# Patient Record
Sex: Male | Born: 1995 | Race: Black or African American | Hispanic: No | Marital: Single | Smoking: Never smoker
Health system: Southern US, Community
[De-identification: ages and names within clinical notes are randomized; demographics above are authoritative.]

## PROBLEM LIST (undated history)

## (undated) DIAGNOSIS — S060XAA Concussion with loss of consciousness status unknown, initial encounter: Secondary | ICD-10-CM

## (undated) DIAGNOSIS — F0781 Postconcussional syndrome: Secondary | ICD-10-CM

## (undated) DIAGNOSIS — S060X9A Concussion with loss of consciousness of unspecified duration, initial encounter: Secondary | ICD-10-CM

## (undated) HISTORY — DX: Postconcussional syndrome: F07.81

## (undated) HISTORY — PX: SHOULDER SURGERY: SHX246

---

## 2017-09-06 ENCOUNTER — Other Ambulatory Visit: Payer: Self-pay

## 2017-09-06 ENCOUNTER — Encounter (HOSPITAL_COMMUNITY): Payer: Self-pay

## 2017-09-06 ENCOUNTER — Observation Stay (HOSPITAL_COMMUNITY)
Admission: EM | Admit: 2017-09-06 | Discharge: 2017-09-07 | Disposition: A | Discharge status: Home / Self Care | Payer: BLUE CROSS/BLUE SHIELD | Attending: Family Medicine | Admitting: Family Medicine

## 2017-09-06 DIAGNOSIS — E876 Hypokalemia: Secondary | ICD-10-CM | POA: Insufficient documentation

## 2017-09-06 DIAGNOSIS — N179 Acute kidney failure, unspecified: Secondary | ICD-10-CM | POA: Diagnosis not present

## 2017-09-06 DIAGNOSIS — E860 Dehydration: Secondary | ICD-10-CM | POA: Diagnosis present

## 2017-09-06 DIAGNOSIS — M6282 Rhabdomyolysis: Secondary | ICD-10-CM | POA: Diagnosis not present

## 2017-09-06 DIAGNOSIS — R748 Abnormal levels of other serum enzymes: Secondary | ICD-10-CM

## 2017-09-06 DIAGNOSIS — M62838 Other muscle spasm: Secondary | ICD-10-CM

## 2017-09-06 LAB — URINALYSIS, ROUTINE W REFLEX MICROSCOPIC
BILIRUBIN URINE: NEGATIVE
GLUCOSE, UA: NEGATIVE mg/dL
HGB URINE DIPSTICK: NEGATIVE
Ketones, ur: NEGATIVE mg/dL
Leukocytes, UA: NEGATIVE
Nitrite: NEGATIVE
Protein, ur: NEGATIVE mg/dL
SPECIFIC GRAVITY, URINE: 1.021 (ref 1.005–1.030)
pH: 5 (ref 5.0–8.0)

## 2017-09-06 LAB — BASIC METABOLIC PANEL
Anion gap: 11 (ref 5–15)
BUN: 19 mg/dL (ref 6–20)
CO2: 21 mmol/L — AB (ref 22–32)
CREATININE: 1.54 mg/dL — AB (ref 0.61–1.24)
Calcium: 10.1 mg/dL (ref 8.9–10.3)
Chloride: 107 mmol/L (ref 98–111)
GFR calc non Af Amer: >60 mL/min (ref >60)
Glucose, Bld: 96 mg/dL (ref 70–99)
Potassium: 4.4 mmol/L (ref 3.5–5.1)
SODIUM: 139 mmol/L (ref 135–145)

## 2017-09-06 LAB — CBC WITH DIFFERENTIAL/PLATELET
Abs Immature Granulocytes: 0 10*3/uL (ref 0.0–0.1)
Basophils Absolute: 0 10*3/uL (ref 0.0–0.1)
Basophils Relative: 0 %
Eosinophils Absolute: 0.2 10*3/uL (ref 0.0–0.7)
Eosinophils Relative: 2 %
HCT: 48.9 % (ref 39.0–52.0)
HEMOGLOBIN: 15.9 g/dL (ref 13.0–17.0)
IMMATURE GRANULOCYTES: 0 %
LYMPHS ABS: 1.3 10*3/uL (ref 0.7–4.0)
Lymphocytes Relative: 12 %
MCH: 30.6 pg (ref 26.0–34.0)
MCHC: 32.5 g/dL (ref 30.0–36.0)
MCV: 94 fL (ref 78.0–100.0)
MONOS PCT: 5 %
Monocytes Absolute: 0.6 10*3/uL (ref 0.1–1.0)
NEUTROS ABS: 8.3 10*3/uL — AB (ref 1.7–7.7)
Neutrophils Relative %: 81 %
Platelets: 178 10*3/uL (ref 150–400)
RBC: 5.2 MIL/uL (ref 4.22–5.81)
RDW: 13.2 % (ref 11.5–15.5)
WBC: 10.3 10*3/uL (ref 4.0–10.5)

## 2017-09-06 LAB — CK: CK TOTAL: 1824 U/L — AB (ref 49–397)

## 2017-09-06 MED ORDER — SODIUM CHLORIDE 0.9 % IV BOLUS
1000.0000 mL | Freq: Once | INTRAVENOUS | Status: AC
  Administered 2017-09-07: 1000 mL via INTRAVENOUS

## 2017-09-06 MED ORDER — SODIUM CHLORIDE 0.9 % IV BOLUS
1000.0000 mL | Freq: Once | INTRAVENOUS | Status: AC
  Administered 2017-09-06: 1000 mL via INTRAVENOUS

## 2017-09-06 NOTE — ED Provider Notes (Signed)
[facility] EM[facility]GENCY [facility] Provider Note   CSN: [id number] Arrival date & time: 09/06/17  1903     History   Chief Complaint Chief Complaint  Patient presents with  . Spasms    HPI Sean Love is a 22 y.o. male with no significant past medical history who presents today for evaluation of muscle cramps.  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  He reports that yesterday he started having cramps and pain in his arms and legs, and does not feel like he adequately rehydrated after practice yesterday.  Initially he started having pain in his quads bilaterally today during practice which slowly went away.  He reports that towards the end of practice coach had him doing "up downs" and his bilateral arms and legs became very sore and locked up.  He is unable to remember the last time he urinated.  He reports that his pain is worse in his legs specifically in his quads, however is in his arms also.  He denies any recent illness or sickness, no nausea or vomiting.  He denies any abdominal pain, chest pain, or shortness of breath.  HPI  History reviewed. No pertinent past medical history.  There are no active problems to display for this patient.   History reviewed. No pertinent surgical history.      Home Medications    Prior to Admission medications   Not on File    Family History History reviewed. No pertinent family history.  Social History Social History   Tobacco Use  . Smoking status: Unknown If Ever Smoked  Substance Use Topics  . Alcohol use: Not on file  . Drug use: Not on file     Allergies   Patient has no known allergies.   Review of Systems Review of Systems  Constitutional: Negative for chills and fever.  HENT: Negative for congestion.   Eyes: Negative for visual disturbance.  Respiratory: Negative for chest tightness and shortness of breath.   Gastrointestinal: Negative for abdominal pain, diarrhea, nausea and vomiting.    Genitourinary: Positive for decreased urine volume. Negative for difficulty urinating and dysuria.  Musculoskeletal: Positive for myalgias. Negative for arthralgias, back pain, neck pain and neck stiffness.  Skin: Negative for color change, pallor and wound.  Neurological: Negative for tremors, weakness, light-headedness, numbness and headaches.  All other systems reviewed and are negative.    Physical Exam Updated Vital Signs BP 121/84 (BP Location: Right Arm)   Pulse 76   Temp 99.4 F (37.4 C) (Oral)   Resp (!) 22   Ht 6\' 2"  (1.88 m)   Wt (!) 138.3 kg (305 lb)   SpO2 99%   BMI 39.16 kg/m   Physical Exam  Constitutional: He is oriented to person, place, and time. He appears well-developed and well-nourished. No distress.  HENT:  Head: Normocephalic and atraumatic.  Mouth/Throat: Oropharynx is clear and moist.  Eyes: Conjunctivae are normal.  Neck: Neck supple.  Cardiovascular: Normal rate, regular rhythm, normal heart sounds and intact distal pulses.  No murmur heard. Pulmonary/Chest: Effort normal and breath sounds normal. No respiratory distress.  Abdominal: Soft. There is no tenderness.  Musculoskeletal: He exhibits no edema.  Diffuse tenderness to palpation over bilateral arms and legs, worse in anterior upper legs.  No deformities, crepitus, ecchymosis or edema palpated.  Neurological: He is alert and oriented to person, place, and time.  Skin: Skin is warm and dry. He is not diaphoretic.  Psychiatric: He has a normal mood  and affect.  Nursing note and vitals reviewed.    ED Treatments / Results  Labs (all labs ordered are listed, but only abnormal results are displayed) Labs Reviewed  CBC WITH DIFFERENTIAL/PLATELET - Abnormal; Notable for the following components:      Result Value   Neutro Abs 8.3 (*)    All other components within normal limits  BASIC METABOLIC PANEL - Abnormal; Notable for the following components:   CO2 21 (*)    Creatinine, Ser 1.54  (*)    All other components within normal limits  CK - Abnormal; Notable for the following components:   Total CK 1,824 (*)    All other components within normal limits  URINALYSIS, ROUTINE W REFLEX MICROSCOPIC    EKG None  Radiology No results found.  Procedures Procedures (including critical care time)  Medications Ordered in ED Medications  sodium chloride 0.9 % bolus 1,000 mL (1,000 mLs Intravenous New Bag/Given 09/06/17 2145)  sodium chloride 0.9 % bolus 1,000 mL (has no administration in time range)  sodium chloride 0.9 % bolus 1,000 mL (0 mLs Intravenous Stopped 09/06/17 2146)     Initial Impression / Assessment and Plan / ED Course  I have reviewed the triage vital signs and the nursing notes.  Pertinent labs & imaging results that were available during my care of the patient were reviewed by me and considered in my medical decision making (see chart for details).  Clinical Course as of Sep 07 2234  Sat Sep 06, 2017  2121 Results discussed with patient, he was informed of elevated CK and creatinine.  Team's athletic trainer who is in the room states that patient already has a follow-up appointment with the team physician on Monday or Tuesday for repeat evaluation and lab draws.  Plan to give 2 additional liters IV allow patient to orally rehydrate, then we will recheck creatinine and CK for improvement.   [EH]    Clinical Course User Index [EH] Cristina Gong, PA-C    Patient presents today for evaluation of muscle spasms.  He was given 500 mL's of fluid by EMS and 100 MCG of fentanyl prior to arrival.  He has been exercising significantly, reports that he has not been taking adequate fluids, and is unsure of the last time he urinated.  Labs were obtained, creatinine is elevated at 1.54, no history to compare to.  CK is elevated at 1, 824.  Patient was given 3 L of IV fluids while in the department, along with encouraged to have p.o. Intake.  Urine was normal without  evidence of infection.  Differential did show elevated neutrophils, however suspect that that is secondary to stress and pain from muscle cramps and spasms.  Plan to redraw CK, creatinine at around 1130 to evaluate.  If they are decreasing will discharge patient home most likely.  At shift change care was transferred to [facility] who will follow pending studies, re-evaulate and determine disposition.     Final Clinical Impressions(s) / ED Diagnoses   Final diagnoses:  Dehydration  Elevated CK  Muscle spasm    ED Discharge Orders    None       Norman Clay 09/06/17 2236    Arby Barrette, MD 09/22/17 1535

## 2017-09-06 NOTE — ED Triage Notes (Signed)
Pt at [employer] football camp. Reports cramps in arms and legs since yesterday. Worse today. Received NS and fentanyl en route.

## 2017-09-07 ENCOUNTER — Other Ambulatory Visit: Payer: Self-pay

## 2017-09-07 DIAGNOSIS — M6282 Rhabdomyolysis: Secondary | ICD-10-CM | POA: Diagnosis not present

## 2017-09-07 DIAGNOSIS — E860 Dehydration: Secondary | ICD-10-CM

## 2017-09-07 LAB — BASIC METABOLIC PANEL
Anion gap: 7 (ref 5–15)
Anion gap: 8 (ref 5–15)
BUN: 10 mg/dL (ref 6–20)
BUN: 11 mg/dL (ref 6–20)
CALCIUM: 8.7 mg/dL — AB (ref 8.9–10.3)
CALCIUM: 8.7 mg/dL — AB (ref 8.9–10.3)
CO2: 24 mmol/L (ref 22–32)
CO2: 22 mmol/L (ref 22–32)
CREATININE: 1.09 mg/dL (ref 0.61–1.24)
Chloride: 110 mmol/L (ref 98–111)
Chloride: 111 mmol/L (ref 98–111)
Creatinine, Ser: 1.04 mg/dL (ref 0.61–1.24)
GFR calc Af Amer: >60 mL/min (ref >60)
GFR calc non Af Amer: >60 mL/min (ref >60)
GLUCOSE: 86 mg/dL (ref 70–99)
Glucose, Bld: 91 mg/dL (ref 70–99)
Potassium: 3.3 mmol/L — ABNORMAL LOW (ref 3.5–5.1)
Potassium: 4.2 mmol/L (ref 3.5–5.1)
SODIUM: 141 mmol/L (ref 135–145)
Sodium: 141 mmol/L (ref 135–145)

## 2017-09-07 LAB — CK
CK TOTAL: 2557 U/L — AB (ref 49–397)
CK TOTAL: 2285 U/L — AB (ref 49–397)
Total CK: 2667 U/L — ABNORMAL HIGH (ref 49–397)

## 2017-09-07 LAB — HIV ANTIBODY (ROUTINE TESTING W REFLEX): HIV Screen 4th Generation wRfx: NONREACTIVE

## 2017-09-07 LAB — CREATININE, SERUM
CREATININE: 1.19 mg/dL (ref 0.61–1.24)
GFR calc non Af Amer: >60 mL/min (ref >60)

## 2017-09-07 MED ORDER — POTASSIUM CHLORIDE CRYS ER 20 MEQ PO TBCR
40.0000 meq | EXTENDED_RELEASE_TABLET | Freq: Once | ORAL | Status: AC
  Administered 2017-09-07: 40 meq via ORAL
  Filled 2017-09-07: qty 2

## 2017-09-07 MED ORDER — SODIUM CHLORIDE 0.9 % IV SOLN
INTRAVENOUS | Status: DC
  Administered 2017-09-07 (×2): via INTRAVENOUS

## 2017-09-07 NOTE — Discharge Summary (Signed)
[facility]  Patient name: Sean Lodema HongSimpson Medical record number: [id number] Date of birth: [birth year] Age: 22 y.o. Gender: male Date of Admission: 09/06/2017  Date of Discharge: 09/07/2017 Admitting Physician: Carney LivingMarshall L Chambliss, MD  Primary Care Provider: Patient, No Pcp Per Consultations: none  Indication for Hospitalization: rhabdomyolysis, dehydration   Discharge Diagnoses/Problem List:  Rhabdomyolysis/dehydration -continuous IV fluids -monitor kidney function and CK levels  Hypokalemia -mild, resolved -potassium PO repletion   Disposition: discharge to home  Discharge Condition: stable, improved  Discharge Exam:  General: well-appearing Heart: RRR. No gallop, rubs, murmmers auscultated  Lungs: CTAB Extremities: no swelling or redness. Painless ROM. Mild tenderness to palpation on calves bilaterally, right worse than left.  Brief Hospital Course:  Presented 09/06/2017 for arm and leg muscle cramps that began after football practice without improvement with ice bath. Received fluid bolus in ED and maintenance fluids. CK levels trended Creatinine and electrolyte levels trended Patient vitals remained stable throughout visit. Slight decrease in potassium to 3.3 repleated with 40mEq PO KCl. Good response Patient had good oral intake and CK levels did not increase significantly. Symptoms improved/resolved. Fluids discontinued. Patient discharged home.  Issues for Follow Up:  1. Follow up with PCP to repeat creatine, electrolytes and CK levels 2. Return to activity- gradual increase level of involvement 1. Encouraging hydration 2. Frequent periods of rest  Significant Procedures: none  Significant Labs and Imaging:  Recent Labs  Lab 09/06/17 2010  WBC 10.3  HGB 15.9  HCT 48.9  PLT 178   Recent Labs  Lab 09/06/17 2010 09/07/17 0014 09/07/17 0354  NA 139  --  141  K 4.4  --  3.3*  CL 107  --  110  CO2 21*  --   24  GLUCOSE 96  --  86  BUN 19  --  10  CREATININE 1.54* 1.19 1.04  CALCIUM 10.1  --  8.7*   CK 8/3: 1,824. CK 8/4: 2,285, 2,557, 2,667 chronologically  Creatinine at discharge was 1.09   Results/Tests Pending at Time of Discharge: none  Discharge Medications:  Allergies as of 09/07/2017   No Known Allergies     Medication List    You have not been prescribed any medications.     Discharge Instructions: Please refer to Patient Instructions section of EMR for full details.  Patient was counseled important signs and symptoms that should prompt return to medical care, changes in medications, dietary instructions, activity restrictions, and follow up appointments.   Follow-Up Appointments: Follow-up Information    Schedule an appointment as soon as possible for a visit  with Your team physician.        Go to  [facility].   Specialty:  Emergency Medicine Why:  As needed, If symptoms worsen Contact information: [address] [id number] mc [address] [id number] [phone]          Leeroy Bocknderson, Aleaha Fickling L, DO 09/07/2017, 7:42 AM PGY-1, [facility]

## 2017-09-07 NOTE — ED Notes (Signed)
Pt's mom at bedside.

## 2017-09-07 NOTE — Progress Notes (Signed)
FPTS Interim Progress Note  S: No acute events since admission overnight. This morning, patient is resting and appeared comfortable. He denies any cramping. Endorses residual mild soreness in legs bilaterally and resolved in arms. He slept well and has been tolerating food and fluids orally well. He and his mother have no other concerns at this time.  O: BP 134/78 (BP Location: Right Arm)   Pulse (!) 56   Temp 98.2 F (36.8 C) (Oral)   Resp (!) 22   Ht 6\' 3"  (1.905 m)   Wt (!) 309 lb 1.4 oz (140.2 kg)   SpO2 99%   BMI 38.63 kg/m   CK showed mild increase this morning at 2,557 (from 2,285).  Creatinine improved to 1.04,  Potassium decreased to 3.3  General: NAD, comfortable Heart: RRR, no murmers Lungs: CTAB Extremities: no tenderness to palpation on upper extremities or thighs. Mild tenderness to palpation on calves left worse than right. No swelling or redness.  A/P: Rhabdomyolysis Admitted 8/3 for muscle cramping after football practice. Repeat CK levels show continued increasing plasma levels this morning.  -repeat CK and BMP in afternoon -continue on fluids 25600mL/hr -encourage oral intake of food and fluids  Mild hypokalemia Labs today showed 3.3 -replete with oral KCl 40mEq -follow with afternoon BMP   Leeroy BockAnderson, Anthonie Lotito L, DO 09/07/2017, 9:08 AM PGY-1, [facility] pager [phone]

## 2017-09-07 NOTE — H&P (Signed)
Family Medicine Teaching [facility] Admission History and Physical Service Pager: [phone]  Patient name: Sean Love Medical record number: [id number] Date of birth: 1995-09-01 Age: 22 y.o. Gender: male  Primary Care Provider: Patient, No Pcp Per Consultants: none Code Status: full  Chief Complaint: muscle cramps  Assessment and Plan: Sean Ellena is a 23 y.o. male presenting with muscle cramps and AKI due to rhabdomyolysis.  He has no significant PMH.  Rhabdomyolysis: Due to intense physical activity at football practice, exacerbated by dehydration.  CK found to be 1,824>2,285 on repeat measurement, and creatinine elevated to 1.54 on admission.  After fluid administration, creatinine improved to 1.19.  Vitals wnl, EKG with normal sinus rhythm. Electrolytes and CBC wnl.  Patient's muscle cramping improved after rest and hydration with 3 L NS in the ED.  No signs of compartment syndrome or cardiac involvement. - admit to observation, attending Dr. Deirdre Priest - NS infusion @ 200 ml/hr - regular diet - am BMP - vitals per unit routine  AKI, resolved: Likely combination of injury from myoglobulin and dehydration.  Improved with fluids. - am BMP - fluid administration  FEN/GI: NS infusion  Prophylaxis: early ambulation  Disposition: home  History of Present Illness:  Sean Mullett is a 22 y.o. male presenting with muscle cramps, found to have rhabdomyolysis.  He began having cramps during football practice on Friday, August 2.  He had some muscle cramping after weight lifting on Friday and doesn't think he hydrated very well during practice.  On Saturday, he lifted weights again during practice and noticed continued muscle cramping that became worse when he began doing up downs.  He continued with practice, hoping that the muscle cramps would go away, but the cramps got so bad that he could not flex or extend his arms and legs easily.  He stopped practice at that time, drink  some Pedialyte, and got in an ice bath.  When his symptoms did not resolve with the ice bath, his coach called EMS.  Patient reports that he began feeling improvement in his muscle cramping once he started getting IV fluids.  He says that he drank "a few sips of alcohol" 2 nights ago, but did not have more than one shot.  He denies chest pain.  He does endorse having dark urine over the past day.  After receiving fluids in the ED, patient's cramping resolved, and he is not currently in any discomfort.  Review Of Systems: Per HPI with the following additions:   Review of Systems  Constitutional: Negative for chills and fever.  Respiratory: Negative for shortness of breath.   Cardiovascular: Negative for chest pain, palpitations and leg swelling.  Gastrointestinal: Negative for constipation, diarrhea, nausea and vomiting.  Genitourinary: Negative for dysuria.  Musculoskeletal: Positive for myalgias.  Neurological: Negative for dizziness and headaches.    Patient Active Problem List   Diagnosis Date Noted  . Rhabdomyolysis 09/07/2017  . Dehydration     Past Medical History: History reviewed. No pertinent past medical history.  Past Surgical History: History reviewed. No pertinent surgical history.  Social History: Social History   Tobacco Use  . Smoking status: Unknown If Ever Smoked  Substance Use Topics  . Alcohol use: Not on file  . Drug use: Not on file   Additional social history: denies drug use  Please also refer to relevant sections of EMR.  Family History: History reviewed. No pertinent family history. Sickle cell trait on mother's side.  He does not have the trait.  Allergies and Medications: No Known Allergies No current facility-administered medications on file prior to encounter.    No current outpatient medications on file prior to encounter.    Objective: BP (!) 144/86   Pulse (!) 56   Temp 98.2 F (36.8 C) (Oral)   Resp 16   Ht 6\' 2"  (1.88 m)   Wt  (!) 305 lb (138.3 kg)   SpO2 98%   BMI 39.16 kg/m  Physical Exam  Constitutional: He is oriented to person, place, and time. He appears well-developed and well-nourished. No distress.  HENT:  Head: Normocephalic and atraumatic.  Eyes: Conjunctivae and EOM are normal. Right eye exhibits no discharge. Left eye exhibits no discharge. No scleral icterus.  Neck: Normal range of motion.  Cardiovascular: Normal rate, regular rhythm, normal heart sounds and intact distal pulses.  Pulmonary/Chest: Effort normal and breath sounds normal. No respiratory distress.  Abdominal: Soft. Bowel sounds are normal. He exhibits no distension. There is no tenderness. There is no guarding.  Musculoskeletal: Normal range of motion. He exhibits no edema, tenderness or deformity.  Neurological: He is alert and oriented to person, place, and time.  Skin: Skin is warm and dry. Capillary refill takes less than 2 seconds. No rash noted.  Psychiatric: He has a normal mood and affect. His behavior is normal.    Labs and Imaging: CBC BMET  Recent Labs  Lab 09/06/17 2010  WBC 10.3  HGB 15.9  HCT 48.9  PLT 178   Recent Labs  Lab 09/06/17 2010 09/07/17 0014  NA 139  --   K 4.4  --   CL 107  --   CO2 21*  --   BUN 19  --   CREATININE 1.54* 1.19  GLUCOSE 96  --   CALCIUM 10.1  --      CK 1,824, 2,285  Lennox SoldersWinfrey, Amanda C, MD 09/07/2017, 1:52 AM PGY-2, [facility] FPTS Intern pager: [phone], text pages welcome

## 2017-09-07 NOTE — ED Notes (Signed)
Pt amublatory to restroom, pt wanting to take shower at this time.

## 2017-09-07 NOTE — Discharge Instructions (Signed)
Today your initial blood work showed that you had elevated levels of CK which is a product of muscle breakdown in addition your creatinine was elevated.  Your CK was 1,824 on admission and 2,667 today..  Your creatinine was 1.54 on admission and 1 today.    Please do not return to sports until you are cleared by a physician.  It is very important that you are staying well-hydrated and replacing fluid losses from physical activity.   Rhabdomyolysis Rhabdomyolysis is a condition that happens when muscle cells break down and release substances into the blood that can damage the kidneys. This condition happens because of damage to the muscles that move bones (skeletal muscle). When the skeletal muscles are damaged, substances inside the muscle cells go into the blood. One of these substances is a protein called myoglobin. Large amounts of myoglobin can cause kidney damage or kidney failure. Other substances that are released by muscle cells may upset the balance of the minerals (electrolytes) in your blood. This imbalance causes your blood to have too much acid (acidosis). What are the causes? This condition is caused by muscle damage. Muscle damage often happens because of:  Using your muscles too much.  An injury that crushes or squeezes a muscle too tightly.  Using illegal drugs, mainly cocaine.  Alcohol abuse.  Other possible causes include:  Prescription medicines, such as those that: ? Lower cholesterol (statins). ? Treat ADHD (attention deficit hyperactivity disorder) or help with weight loss (amphetamines). ? Treat pain (opiates).  Infections.  Muscle diseases that are passed down from parent to child (inherited).  High fever.  Heatstroke.  Not having enough fluids in your body (dehydration).  Seizures.  Surgery.  What increases the risk? This condition is more likely to develop in people who:  Have a family history of muscle disease.  Take part in extreme sports,  such as running in marathons.  Have diabetes.  Are older.  Abuse drugs or alcohol.  What are the signs or symptoms? Symptoms of this condition vary. Some people have very few symptoms, and other people have many symptoms. The most common symptoms include:  Muscle pain and swelling.  Weak muscles.  Dark urine.  Feeling weak and tired.  Other symptoms include:  Nausea and vomiting.  Fever.  Pain in the abdomen.  Pain in the joints.  Symptoms of complications from this condition include:  Heart rhythm that is not normal (arrhythmia).  Seizures.  Not urinating enough because of kidney failure.  Very low blood pressure (shock). Signs of shock include dizziness, blurry vision, and clammy skin.  Bleeding that is hard to stop or control.  How is this diagnosed? This condition is diagnosed based on your medical history, your symptoms, and a physical exam. Tests may also be done, including:  Blood tests.  Urine tests to check for myoglobin.  You may also have other tests to check for causes of muscle damage and to check for complications. How is this treated? Treatment for this condition helps to:  Make sure you have enough fluids in your body.  Lower the acid levels in your blood to reverse acidosis.  Protect your kidneys.  Treatment may include:  Fluids and medicines given through an IV tube that is inserted into one of your veins.  Medicines to lower acidosis or to bring back the balance of the minerals in your body.  Hemodialysis. This treatment uses an artificial kidney machine to filter your blood while you recover. You may have  this if other treatments are not helping.  Follow these instructions at home:  Take over-the-counter and prescription medicines only as told by your health care provider.  Rest at home until your health care provider says that you can return to your normal activities.  Drink enough fluid to keep your urine clear or pale  yellow.  Do not do activities that take a lot of effort (are strenuous). Ask your health care provider what level of exercise is safe for you.  Do not abuse drugs or alcohol. If you are having problems with drug or alcohol use, ask your health care provider for help.  Keep all follow-up visits as told by your health care provider. This is important. Contact a health care provider if:  You start having symptoms of this condition after treatment. Get help right away if:  You have a seizure.  You bleed easily or cannot control bleeding.  You cannot urinate.  You have chest pain.  You have trouble breathing. This information is not intended to replace advice given to you by your health care provider. Make sure you discuss any questions you have with your health care provider. Document Released: 01/04/2004 Document Revised: 11/03/2015 Document Reviewed: 11/03/2015 Elsevier Interactive Patient Education  [employer]2018 Elsevier Inc.

## 2017-09-07 NOTE — ED Provider Notes (Signed)
Care assumed from [facility] Hammond, [place]PA-C.  Please see her full H&P.  In short,  Sean Love is a 22 y.o. male presents for muscle cramps worsening over the last several days.  He is currently attending [employer] football camp.  He reports pain and cramps in his arms, legs.  He states he did not feel like he adequately rehydrated after practice yesterday and has had some limited urination.  Physical Exam  BP (!) 142/78 (BP Location: Left Arm)   Pulse 66   Temp 98.2 F (36.8 C) (Oral)   Resp 16   Ht 6\' 2"  (1.88 m)   Wt (!) 138.3 kg (305 lb)   SpO2 100%   BMI 39.16 kg/m   Physical Exam  Constitutional: He appears well-developed and well-nourished. No distress.  HENT:  Head: Normocephalic.  Eyes: Conjunctivae are normal. No scleral icterus.  Neck: Normal range of motion.  Cardiovascular: Normal rate and intact distal pulses.  Pulmonary/Chest: Effort normal.  Musculoskeletal: Normal range of motion.  Neurological: He is alert.  Skin: Skin is warm and dry.  Nursing note and vitals reviewed.   ED Course/Procedures   Clinical Course as of Sep 07 199  Sat Sep 06, 2017  2121 Results discussed with patient, he was informed of elevated CK and creatinine.  Team's athletic trainer who is in the room states that patient already has a follow-up appointment with the team physician on Monday or Tuesday for repeat evaluation and lab draws.  Plan to give 2 additional liters IV allow patient to orally rehydrate, then we will recheck creatinine and CK for improvement.   [EH]  Sun Sep 07, 2017  0108 Patient reports significant decrease in his cramping.  He has had a total of 3.5 L of fluid.  Repeat labs are pending.   [HM]  0121 Increasing despite fluids.  Patient will need admission.  CK Total(!): 2,285 [HM]  0121 Serum creatinine has improved.  Creatinine: 1.19 [HM]  0159 Discussed with Dr. Frances FurbishWinfrey who will admit.     [HM]    Clinical Course User Index [EH] Cristina GongHammond, Elizabeth W, PA-C [HM]  Ritaj Dullea, Dahlia ClientHannah, PA-C    Procedures  MDM    Pt with elevated CK and Creatinine. Concern for Rhabdo.  Pt hydrated and has improved creatinine, but rising CK despite fluids.  No AMS.  Vials stable.  Pt will need admission.    Dehydration  Elevated CK  Muscle spasm  Non-traumatic rhabdomyolysis      Milta DeitersMuthersbaugh, Shiree Altemus, PA-C 09/07/17 0201    Linwood DibblesKnapp, Jon, MD 09/09/17 [phone]

## 2018-03-22 ENCOUNTER — Other Ambulatory Visit: Payer: Self-pay

## 2018-03-22 ENCOUNTER — Emergency Department (HOSPITAL_COMMUNITY): Payer: BLUE CROSS/BLUE SHIELD

## 2018-03-22 ENCOUNTER — Emergency Department (HOSPITAL_COMMUNITY)
Admission: EM | Admit: 2018-03-22 | Discharge: 2018-03-22 | Disposition: A | Discharge status: Home / Self Care | Payer: BLUE CROSS/BLUE SHIELD | Attending: Emergency Medicine | Admitting: Emergency Medicine

## 2018-03-22 ENCOUNTER — Encounter (HOSPITAL_COMMUNITY): Payer: Self-pay

## 2018-03-22 DIAGNOSIS — M542 Cervicalgia: Secondary | ICD-10-CM | POA: Diagnosis present

## 2018-03-22 DIAGNOSIS — Y92481 Parking lot as the place of occurrence of the external cause: Secondary | ICD-10-CM | POA: Diagnosis not present

## 2018-03-22 DIAGNOSIS — M25561 Pain in right knee: Secondary | ICD-10-CM | POA: Diagnosis not present

## 2018-03-22 DIAGNOSIS — Y939 Activity, unspecified: Secondary | ICD-10-CM | POA: Insufficient documentation

## 2018-03-22 DIAGNOSIS — Y998 Other external cause status: Secondary | ICD-10-CM | POA: Insufficient documentation

## 2018-03-22 DIAGNOSIS — R51 Headache: Secondary | ICD-10-CM | POA: Diagnosis not present

## 2018-03-22 HISTORY — DX: Concussion with loss of consciousness of unspecified duration, initial encounter: S06.0X9A

## 2018-03-22 HISTORY — DX: Concussion with loss of consciousness status unknown, initial encounter: S06.0XAA

## 2018-03-22 NOTE — ED Notes (Signed)
Patient verbalizes understanding of discharge instructions. Opportunity for questioning and answers were provided. Armband removed by staff, pt discharged from ED.  

## 2018-03-22 NOTE — Discharge Instructions (Addendum)
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
## 2018-04-16 ENCOUNTER — Ambulatory Visit (INDEPENDENT_AMBULATORY_CARE_PROVIDER_SITE_OTHER): Payer: BLUE CROSS/BLUE SHIELD | Admitting: Neurology

## 2018-04-16 ENCOUNTER — Encounter: Payer: Self-pay | Admitting: Neurology

## 2018-04-16 ENCOUNTER — Other Ambulatory Visit: Payer: Self-pay

## 2018-04-16 DIAGNOSIS — F0781 Postconcussional syndrome: Secondary | ICD-10-CM

## 2018-04-16 HISTORY — DX: Postconcussional syndrome: F07.81

## 2018-04-16 MED ORDER — NORTRIPTYLINE HCL 10 MG PO CAPS: ORAL_CAPSULE | ORAL | 3 refills | Status: DC
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
## 2018-05-18 ENCOUNTER — Other Ambulatory Visit: Payer: Self-pay | Admitting: Neurology

## 2018-08-11 ENCOUNTER — Emergency Department (HOSPITAL_COMMUNITY)
Admission: EM | Admit: 2018-08-11 | Discharge: 2018-08-11 | Disposition: A | Discharge status: Home / Self Care | Payer: 59 | Attending: Pediatric Emergency Medicine | Admitting: Pediatric Emergency Medicine

## 2018-08-11 ENCOUNTER — Emergency Department (HOSPITAL_COMMUNITY): Payer: 59

## 2018-08-11 ENCOUNTER — Encounter (HOSPITAL_COMMUNITY): Payer: Self-pay | Admitting: Emergency Medicine

## 2018-08-11 DIAGNOSIS — F419 Anxiety disorder, unspecified: Secondary | ICD-10-CM | POA: Diagnosis not present

## 2018-08-11 DIAGNOSIS — R51 Headache: Secondary | ICD-10-CM | POA: Insufficient documentation

## 2018-08-11 DIAGNOSIS — F1290 Cannabis use, unspecified, uncomplicated: Secondary | ICD-10-CM | POA: Diagnosis not present

## 2018-08-11 DIAGNOSIS — R079 Chest pain, unspecified: Secondary | ICD-10-CM

## 2018-08-11 DIAGNOSIS — R519 Headache, unspecified: Secondary | ICD-10-CM

## 2018-08-11 DIAGNOSIS — R0789 Other chest pain: Secondary | ICD-10-CM | POA: Diagnosis present

## 2018-08-11 LAB — CBC
HCT: 45.6 % (ref 39.0–52.0)
Hemoglobin: 14.9 g/dL (ref 13.0–17.0)
MCH: 30.4 pg (ref 26.0–34.0)
MCHC: 32.7 g/dL (ref 30.0–36.0)
MCV: 93.1 fL (ref 80.0–100.0)
Platelets: 167 K/uL (ref 150–400)
RBC: 4.9 MIL/uL (ref 4.22–5.81)
RDW: 12.9 % (ref 11.5–15.5)
WBC: 4.6 K/uL (ref 4.0–10.5)
nRBC: 0 % (ref 0.0–0.2)

## 2018-08-11 LAB — BASIC METABOLIC PANEL WITH GFR
Anion gap: 7 (ref 5–15)
BUN: 5 mg/dL — ABNORMAL LOW (ref 6–20)
CO2: 25 mmol/L (ref 22–32)
Calcium: 9.4 mg/dL (ref 8.9–10.3)
Chloride: 109 mmol/L (ref 98–111)
Creatinine, Ser: 1.13 mg/dL (ref 0.61–1.24)
GFR calc Af Amer: >60 mL/min (ref >60)
GFR calc non Af Amer: >60 mL/min (ref >60)
Glucose, Bld: 94 mg/dL (ref 70–99)
Potassium: 4.3 mmol/L (ref 3.5–5.1)
Sodium: 141 mmol/L (ref 135–145)

## 2018-08-11 LAB — TROPONIN I (HIGH SENSITIVITY): Troponin I (High Sensitivity): <2 ng/L (ref <18)

## 2018-08-11 MED ORDER — SUCRALFATE 1 G PO TABS
1.0000 g | ORAL_TABLET | Freq: Once | ORAL | Status: AC
  Administered 2018-08-11: 1 g via ORAL
  Filled 2018-08-11: qty 1

## 2018-08-11 MED ORDER — SODIUM CHLORIDE 0.9% FLUSH: 3.0000 mL | Freq: Once | INTRAVENOUS | Status: DC

## 2018-08-11 NOTE — ED Triage Notes (Signed)
Pt with substernal pain on going for months, non radiating. Hx of GERD. Denies sob/nv/d or fever. A/O x4

## 2018-08-11 NOTE — ED Provider Notes (Signed)
[facility] Provider Note   CSN: [id number] Arrival date & time: 08/11/18  1326    History   Chief Complaint Chief Complaint  Patient presents with  . Chest Pain    HPI  Patient is 23 yo M, with PMHx of GERD and panic attacks, presenting with chest pain. Patient states he has had 2-3 panic attacks in the past week with chest pain and pain has not resolved. Pain is localized to left side, sharp, non radiating, not worsening with inspiration, rated at 6-7/10 at its worst. Pain is not associated with exertion, eating, no SOB, no syncope, no dizziness, no n/v, no hemtochezia, no fever, no d/c, no injury. Patient came to ED because he is more concerned about the pain. Patient has a history of GERD, on pantoprazole and carafate. He is not complaint with these medications. No sick contacts or family history of cardiac issues.     Past Medical History:  Diagnosis Date  . Concussion   . Post concussion syndrome 04/16/2018    Patient Active Problem List   Diagnosis Date Noted  . Post concussion syndrome 04/16/2018  . Rhabdomyolysis 09/07/2017  . Dehydration     Past Surgical History:  Procedure Laterality Date  . SHOULDER SURGERY          Home Medications    Prior to Admission medications   Medication Sig Start Date End Date Taking? Authorizing Provider  nortriptyline (PAMELOR) 10 MG capsule TAKE 1 CAPSULE AT NIGHT X ONE WEEK, THEN TAKE 2 CAPS AT NIGHT X ONE WEEK, THEN TAKE 3 CAPS AT NIGHT 05/18/18   York SpanielWillis, Charles K, MD    Family History Family History  Problem Relation Age of Onset  . Sleep disorder Mother   . Migraines Mother     Social History Social History   Tobacco Use  . Smoking status: Never Smoker  . Smokeless tobacco: Never Used  Substance Use Topics  . Alcohol use: Yes  . Drug use: Yes    Types: Marijuana     Allergies   Patient has no known allergies.   Review of Systems Review of Systems   Cardiovascular: Positive for chest pain.  Neurological: Positive for headaches.     Physical Exam Updated Vital Signs BP 138/86 (BP Location: Left Arm)   Pulse 60   Temp 98.6 F (37 C) (Oral)   Resp 20   SpO2 100%   Physical Exam Vitals signs and nursing note reviewed.  Constitutional:      Appearance: He is well-developed.  HENT:     Head: Normocephalic and atraumatic.  Eyes:     Conjunctiva/sclera: Conjunctivae normal.  Neck:     Musculoskeletal: Neck supple.  Cardiovascular:     Rate and Rhythm: Normal rate. Rhythm irregular.     Chest Wall: PMI is not displaced.     Heart sounds: Normal heart sounds. No murmur. No friction rub. No gallop.   Pulmonary:     Effort: Pulmonary effort is normal. No respiratory distress.     Breath sounds: Normal breath sounds.  Abdominal:     Palpations: Abdomen is soft.     Tenderness: There is no abdominal tenderness.  Skin:    General: Skin is warm and dry.  Neurological:     Mental Status: He is alert.      ED Treatments / Results  Labs (all labs ordered are listed, but only abnormal results are displayed) Labs Reviewed  BASIC METABOLIC PANEL - Abnormal;  Notable for the following components:      Result Value   BUN 5 (*)    All other components within normal limits  CBC  TROPONIN I (HIGH SENSITIVITY)  TROPONIN I (HIGH SENSITIVITY)    EKG EKG Interpretation  Date/Time:  Tuesday August 11 2018 14:13:37 EDT Ventricular Rate:  53 PR Interval:  134 QRS Duration: 88 QT Interval:  374 QTC Calculation: 350 R Axis:   39 Text Interpretation:  Sinus bradycardia Confirmed by Glenice Bow [phone]) on 08/11/2018 3:29:22 PM   Radiology Dg Chest 2 View  Result Date: 08/11/2018 CLINICAL DATA:  Chest pain EXAM: CHEST - 2 VIEW COMPARISON:  None. FINDINGS: Lungs are clear. Heart size and pulmonary vascularity are normal. No adenopathy. No pneumothorax. There is slight upper thoracic levoscoliosis. IMPRESSION: No edema or  consolidation. Electronically Signed   By: Lowella Grip III M.D.   On: 08/11/2018 14:45    Procedures Procedures (including critical care time)  Medications Ordered in ED Medications  sodium chloride flush (NS) 0.9 % injection 3 mL (has no administration in time range)  sucralfate (CARAFATE) tablet 1 g (has no administration in time range)     Initial Impression / Assessment and Plan / ED Course  I have reviewed the triage vital signs and the nursing notes.  Patient is 23 yo M with PMHx of GERD, presenting with chest pain. Pt states pain has been intermittent for the past 4 months, 6/10 at its worse, non radiating, not pleuritic, described as sharp, no associated SOB/syncope/nausea/vomiting. Patient does not have a family history of cardiac issues. Patient states that he is anxious about his chest pain. He has a history of panic attacks in the past week and has been seen by his PCP and prescribed an anti-anxiety medication, which he has not taken.  Upon examination, patient is resting comfortable, NAD, VSS, afebrile. Cardiac exam notable for irregular rhythm, no murmurs, no rubs, no gallops, PMI non displaced, no costochondral tenderness. 2/2 pulses in all extremities. Abdominal exam notable for BS throughout all four quadrants, soft, non tender, no palpable masses.   While here, cardiac labs were drawn and all wnl. Chest x ray was wnl with lungs clear bilaterally, heart not enlarged, no adenopathy, no pneumothorax, + upper thoracic levoscoliosis. EKG was significant for sinus bradycardia. Presentation is thought to be due to GERD with underlying anxiety. He was treated with a dose of carafate and instructed to follow up with his PCP for management of GERD and anxiety. Upon re-evaluation, pain has resolved and patient feels comfortable going home. He was educated on return precautions.    Pertinent labs & imaging results that were available during my care of the patient were reviewed by  me and considered in my medical decision making (see chart for details).          Final Clinical Impressions(s) / ED Diagnoses   Final diagnoses:  Chest pain, unspecified type  Headache in back of head  Anxiety    ED Discharge Orders    None       Andrey Campanile, MD 08/11/18 1655    Brent Bulla, MD 08/11/18 [phone]

## 2018-08-11 NOTE — Discharge Instructions (Signed)
Please follow up with your primary care physician to resume your anxiety and GERD medications. If you develop worsening chest pain, shortness of breath, passing out, or heart palpitations please return to the ER.

## 2018-08-24 ENCOUNTER — Encounter: Payer: Self-pay | Admitting: Cardiology

## 2018-08-24 ENCOUNTER — Ambulatory Visit (INDEPENDENT_AMBULATORY_CARE_PROVIDER_SITE_OTHER): Payer: 59 | Admitting: Cardiology

## 2018-08-24 ENCOUNTER — Other Ambulatory Visit: Payer: Self-pay

## 2018-08-24 VITALS — BP 136/76 | HR 75 | Ht 72.2 in | Wt 306.2 lb

## 2018-08-24 DIAGNOSIS — R002 Palpitations: Secondary | ICD-10-CM | POA: Diagnosis not present

## 2018-08-24 DIAGNOSIS — R0789 Other chest pain: Secondary | ICD-10-CM | POA: Diagnosis not present

## 2018-08-24 DIAGNOSIS — R079 Chest pain, unspecified: Secondary | ICD-10-CM

## 2018-08-24 NOTE — Progress Notes (Signed)
Cardiology Office Note:    Date:  08/24/2018   ID:  Sean Love, DOB [birth year], MRN [id number]  PCP:  Otho DarnerMcKinley, Dominic W, MD  Cardiologist:  Donato SchultzMark , MD  Electrophysiologist:  None   Referring MD: Otho DarnerMcKinley, Dominic W, MD     History of Present Illness:    Sean Lodema HongSimpson is a 23 y.o. male here for the evaluation of chest pain at the request of Ashley/Dr. Eula Listenominic [facility]ckin[facility] [employer] [facility].  His most recent episode occurred today during a conditioning session (plays football).  He was running and felt sharp sternal/left of sternal chest discomfort which made him stop his run.  He stated that when he took in a deep breath his pain worsened.  He also noted some easing of the discomfort when pushing on his chest wall.  After this session, when he was sitting on his bed and turned his torso, he felt the sharp discomfort return.  He then went to student health where he was seen by Morrie SheldonAshley, [place] there who called me to discuss his case.  Back in July 7 he was in the emergency room with a similar type of discomfort, ended up having troponin drawn high-sensitivity which was normal, 2.  His CK was elevated because of muscle breakdown during exercise.  To be expected.  He has no prior history of clotting disorder.  He is not short of breath.  No fevers cough chills.  On Saturday, during a conditioning session he felt a similar type of chest discomfort.  No associated syncope.  He is never had syncope during conditioning efforts.  He has also felt at times a pounding heart like sensation, skipped beat occasionally.  No significant racing episodes that ever led to chest discomfort or shortness of breath or fainting.  A few months ago he began to start having some panic attacks and had been prescribed sertraline.  No early CAD. Cousin died in her sleep ? reason heart.   He said he used to smoke and drink but he has not done so in several months.  He denied any stimulants.   Zoloft  PPI-Protonix for GERD.    Past Medical History:  Diagnosis Date  . Concussion   . Post concussion syndrome 04/16/2018    Past Surgical History:  Procedure Laterality Date  . SHOULDER SURGERY      Current Medications: Current Meds  Medication Sig  . pantoprazole (PROTONIX) 40 MG tablet Take 40 mg by mouth daily.  . sertraline (ZOLOFT) 50 MG tablet Take 50 mg by mouth daily.     Allergies:   Patient has no known allergies.   Social History   Socioeconomic History  . Marital status: Single    Spouse name: Not on file  . Number of children: Not on file  . Years of education: Not on file  . Highest education level: Not on file  Occupational History  . Not on file  Social Needs  . Financial resource strain: Not on file  . Food insecurity    Worry: Not on file    Inability: Not on file  . Transportation needs    Medical: Not on file    Non-medical: Not on file  Tobacco Use  . Smoking status: Never Smoker  . Smokeless tobacco: Never Used  Substance and Sexual Activity  . Alcohol use: Yes  . Drug use: Yes    Types: Marijuana  . Sexual activity: Not on file  Lifestyle  . Physical activity  Days per week: Not on file    Minutes per session: Not on file  . Stress: Not on file  Relationships  . Social Musicianconnections    Talks on phone: Not on file    Gets together: Not on file    Attends religious service: Not on file    Active member of club or organization: Not on file    Attends meetings of clubs or organizations: Not on file    Relationship status: Not on file  Other Topics Concern  . Not on file  Social History Narrative  . Not on file     Family History: The patient's family history includes Migraines in his mother; Sleep disorder in his mother.  ROS:   Please see the history of present illness.     All other systems reviewed and are negative.  EKGs/Labs/Other Studies Reviewed:    The following studies were reviewed today: ER notes  reviewed  EKG:  EKG is  ordered today.  The ekg ordered today demonstrates sinus bradycardia 57 with no other significant abnormalities.  Normal intervals, normal T wave confirmations.  No change from prior EKG  Recent Labs: 08/11/2018: BUN 5; Creatinine, Ser 1.13; Hemoglobin 14.9; Platelets 167; Potassium 4.3; Sodium 141  Recent Lipid Panel No results found for: CHOL, TRIG, HDL, CHOLHDL, VLDL, LDLCALC, LDLDIRECT  Physical Exam:    VS:  BP 136/76   Pulse 75   Ht 6' 0.2" (1.834 m)   Wt (!) 306 lb 3.2 oz (138.9 kg)   SpO2 98%   BMI 41.30 kg/m     Wt Readings from Last 3 Encounters:  08/24/18 (!) 306 lb 3.2 oz (138.9 kg)  04/16/18 (!) 314 lb (142.4 kg)  09/07/17 (!) 309 lb 1.4 oz (140.2 kg)     GEN:  Well nourished, well developed in no acute distress HEENT: Normal NECK: No JVD; No carotid bruits LYMPHATICS: No lymphadenopathy CARDIAC: RRR, no murmurs, rubs, gallops, he did have some pain to palpation along his chest wall. RESPIRATORY:  Clear to auscultation without rales, wheezing or rhonchi  ABDOMEN: Soft, non-tender, non-distended MUSCULOSKELETAL:  No edema; No deformity  SKIN: Warm and dry NEUROLOGIC:  Alert and oriented x 3 PSYCHIATRIC:  Normal affect   ASSESSMENT:    1. Chest pain of uncertain etiology   2. Palpitations    PLAN:    In order of problems listed above:  Atypical chest discomfort - Clearly he does have musculoskeletal chest wall discomfort.  Worse with deep inspiration and twisting.  Also can change with palpation.  Recent troponin high-sensitivity was normal.  EKG reassuring.  Clinical suspicion for PE is very low. -I will check an echocardiogram to ensure proper structure and function of his heart.  Palpitations - I will check a ZIO patch monitor for 14 days to make sure that he is not having any adverse arrhythmias.  Sounds like PVCs from description.  Described to him.  Avoid excessive caffeine use.  Continue with adequate hydration.  We will  follow-up with results of studies.  Medication Adjustments/Labs and Tests Ordered: Current medicines are reviewed at length with the patient today.  Concerns regarding medicines are outlined above.  Orders Placed This Encounter  Procedures  . LONG TERM MONITOR (3-14 DAYS)  . ECHOCARDIOGRAM COMPLETE   No orders of the defined types were placed in this encounter.   Patient Instructions  Medication Instructions:  The current medical regimen is effective;  continue present plan and medications.  If you need  a refill on your cardiac medications before your next appointment, please call your pharmacy.   Testing/Procedures: Your physician has requested that you have an echocardiogram. Echocardiography is a painless test that uses sound waves to create images of your heart. It provides your doctor with information about the size and shape of your heart and how well your heart's chambers and valves are working. This procedure takes approximately one hour. There are no restrictions for this procedure.  Your physician has recommended that you wear a holter monitor for 14 days. Holter monitors are medical devices that record the heart's electrical activity. Doctors most often use these monitors to diagnose arrhythmias. Arrhythmias are problems with the speed or rhythm of the heartbeat. The monitor is a small, portable device. You can wear one while you do your normal daily activities. This is usually used to diagnose what is causing palpitations/syncope (passing out).  Follow-Up: Follow up will be based on the results of the above testing.  Thank you for choosing [facility]!!        Signed, Candee Furbish, MD  08/24/2018 5:24 PM    [place]

## 2018-08-24 NOTE — Patient Instructions (Signed)
Medication Instructions:  The current medical regimen is effective;  continue present plan and medications.  If you need a refill on your cardiac medications before your next appointment, please call your pharmacy.   Testing/Procedures: Your physician has requested that you have an echocardiogram. Echocardiography is a painless test that uses sound waves to create images of your heart. It provides your doctor with information about the size and shape of your heart and how well your heart's chambers and valves are working. This procedure takes approximately one hour. There are no restrictions for this procedure.  Your physician has recommended that you wear a holter monitor for 14 days. Holter monitors are medical devices that record the heart's electrical activity. Doctors most often use these monitors to diagnose arrhythmias. Arrhythmias are problems with the speed or rhythm of the heartbeat. The monitor is a small, portable device. You can wear one while you do your normal daily activities. This is usually used to diagnose what is causing palpitations/syncope (passing out).  Follow-Up: Follow up will be based on the results of the above testing.  Thank you for choosing [facility]!!

## 2018-08-26 ENCOUNTER — Telehealth: Payer: Self-pay | Admitting: Radiology

## 2018-08-26 NOTE — Telephone Encounter (Signed)
Spoke with patient regarding his Zio monitor that was ordered for him. Patient is currently at school and would prefer the monitor to be mailed there. He will return my call in the next few days with the correct mailing address. Instructions were gone over with patient and he knows to expect monitor to arrive in 3-4 days after we get his address.

## 2018-08-31 ENCOUNTER — Other Ambulatory Visit: Payer: Self-pay

## 2018-08-31 ENCOUNTER — Ambulatory Visit (HOSPITAL_COMMUNITY): Payer: 59 | Attending: Cardiology

## 2018-08-31 DIAGNOSIS — R002 Palpitations: Secondary | ICD-10-CM | POA: Diagnosis present

## 2018-08-31 DIAGNOSIS — R0789 Other chest pain: Secondary | ICD-10-CM | POA: Insufficient documentation

## 2018-08-31 DIAGNOSIS — R079 Chest pain, unspecified: Secondary | ICD-10-CM

## 2018-09-01 NOTE — Telephone Encounter (Signed)
Monitor was mailed per patient request to: [facility] [address] [place] [address]

## 2018-09-07 ENCOUNTER — Telehealth: Payer: Self-pay | Admitting: Cardiology

## 2018-09-07 NOTE — Telephone Encounter (Signed)
New Message      Hollie Beach is calling from [employer] and would like the office notes for the Pts Echo and Appt with Dr Marlou Porch   Fax  [phone]

## 2018-09-07 NOTE — Telephone Encounter (Signed)
Will send to MR to obtain ROI from patient in order to be able to fax records to [facility].  There is a DPR on file to release information to the Athletic Trainer but not anyone named Ashleigh.Marland Kitchen

## 2018-09-19 ENCOUNTER — Other Ambulatory Visit: Payer: Self-pay

## 2018-09-19 DIAGNOSIS — Z20822 Contact with and (suspected) exposure to covid-19: Secondary | ICD-10-CM

## 2018-09-20 LAB — NOVEL CORONAVIRUS, NAA: SARS-CoV-2, NAA: NOT DETECTED

## 2019-06-08 ENCOUNTER — Other Ambulatory Visit: Payer: Self-pay | Admitting: *Deleted

## 2019-06-08 DIAGNOSIS — U071 COVID-19: Secondary | ICD-10-CM

## 2019-06-14 ENCOUNTER — Other Ambulatory Visit: Payer: Self-pay | Admitting: *Deleted

## 2019-06-14 ENCOUNTER — Ambulatory Visit (HOSPITAL_COMMUNITY): Payer: BC Managed Care – PPO | Attending: Cardiology

## 2019-06-14 ENCOUNTER — Other Ambulatory Visit: Payer: Self-pay

## 2019-06-14 DIAGNOSIS — U071 COVID-19: Secondary | ICD-10-CM

## 2019-06-14 LAB — TROPONIN I (HIGH SENSITIVITY): Troponin I (High Sensitivity): 5 ng/L (ref <18)
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
